# Patient Record
Sex: Male | Born: 1980 | Race: White | Hispanic: No | Marital: Married | Smoking: Current every day smoker
Health system: Southern US, Community
[De-identification: ages and names within clinical notes are randomized; demographics above are authoritative.]

## PROBLEM LIST (undated history)

## (undated) HISTORY — PX: CLEFT PALATE REPAIR: SUR1165

---

## 2000-05-16 ENCOUNTER — Emergency Department (HOSPITAL_COMMUNITY): Admission: EM | Admit: 2000-05-16 | Discharge: 2000-05-16 | Payer: Self-pay | Admitting: Emergency Medicine

## 2000-05-16 ENCOUNTER — Encounter: Payer: Self-pay | Admitting: Emergency Medicine

## 2002-05-01 ENCOUNTER — Emergency Department (HOSPITAL_COMMUNITY): Admission: EM | Admit: 2002-05-01 | Discharge: 2002-05-01 | Payer: Self-pay | Admitting: Emergency Medicine

## 2003-07-29 ENCOUNTER — Emergency Department (HOSPITAL_COMMUNITY): Admission: EM | Admit: 2003-07-29 | Discharge: 2003-07-29 | Payer: Self-pay | Admitting: Emergency Medicine

## 2004-08-19 ENCOUNTER — Emergency Department (HOSPITAL_COMMUNITY): Admission: EM | Admit: 2004-08-19 | Discharge: 2004-08-20 | Payer: Self-pay | Admitting: *Deleted

## 2005-03-23 ENCOUNTER — Emergency Department (HOSPITAL_COMMUNITY): Admission: EM | Admit: 2005-03-23 | Discharge: 2005-03-23 | Payer: Self-pay | Admitting: Emergency Medicine

## 2005-04-04 ENCOUNTER — Emergency Department (HOSPITAL_COMMUNITY): Admission: EM | Admit: 2005-04-04 | Discharge: 2005-04-04 | Payer: Self-pay | Admitting: Emergency Medicine

## 2005-11-21 ENCOUNTER — Emergency Department (HOSPITAL_COMMUNITY): Admission: EM | Admit: 2005-11-21 | Discharge: 2005-11-21 | Payer: Self-pay | Admitting: Emergency Medicine

## 2007-10-16 ENCOUNTER — Emergency Department: Payer: Self-pay | Admitting: Unknown Physician Specialty

## 2008-02-18 ENCOUNTER — Emergency Department (HOSPITAL_COMMUNITY): Admission: EM | Admit: 2008-02-18 | Discharge: 2008-02-18 | Payer: Self-pay | Admitting: Emergency Medicine

## 2008-10-16 ENCOUNTER — Emergency Department (HOSPITAL_COMMUNITY): Admission: EM | Admit: 2008-10-16 | Discharge: 2008-10-17 | Payer: Self-pay | Admitting: Emergency Medicine

## 2009-02-07 ENCOUNTER — Emergency Department (HOSPITAL_COMMUNITY): Admission: EM | Admit: 2009-02-07 | Discharge: 2009-02-07 | Payer: Self-pay | Admitting: Emergency Medicine

## 2009-02-07 ENCOUNTER — Emergency Department (HOSPITAL_COMMUNITY): Admission: EM | Admit: 2009-02-07 | Discharge: 2009-02-08 | Payer: Self-pay | Admitting: Emergency Medicine

## 2009-10-16 ENCOUNTER — Emergency Department (HOSPITAL_COMMUNITY): Admission: EM | Admit: 2009-10-16 | Discharge: 2009-10-16 | Payer: Self-pay | Admitting: Emergency Medicine

## 2010-01-07 ENCOUNTER — Emergency Department (HOSPITAL_COMMUNITY)
Admission: EM | Admit: 2010-01-07 | Discharge: 2010-01-07 | Payer: Self-pay | Source: Home / Self Care | Admitting: Emergency Medicine

## 2010-04-10 LAB — DIFFERENTIAL
Basophils Absolute: 0 10*3/uL (ref 0.0–0.1)
Basophils Relative: 0 % (ref 0–1)
Lymphocytes Relative: 16 % (ref 12–46)
Monocytes Absolute: 0.6 10*3/uL (ref 0.1–1.0)
Neutro Abs: 8.1 10*3/uL — ABNORMAL HIGH (ref 1.7–7.7)
Neutrophils Relative %: 77 % (ref 43–77)

## 2010-04-10 LAB — BASIC METABOLIC PANEL WITH GFR
BUN: 13 mg/dL (ref 6–23)
CO2: 32 meq/L (ref 19–32)
Calcium: 10 mg/dL (ref 8.4–10.5)
Chloride: 99 meq/L (ref 96–112)
Creatinine, Ser: 0.94 mg/dL (ref 0.4–1.5)
GFR calc non Af Amer: >60 mL/min
Glucose, Bld: 117 mg/dL — ABNORMAL HIGH (ref 70–99)
Potassium: 3.9 meq/L (ref 3.5–5.1)
Sodium: 139 meq/L (ref 135–145)

## 2010-04-10 LAB — CBC
HCT: 43 % (ref 39.0–52.0)
MCHC: 34 g/dL (ref 30.0–36.0)
Platelets: 249 10*3/uL (ref 150–400)
RDW: 13.7 % (ref 11.5–15.5)
WBC: 10.5 10*3/uL (ref 4.0–10.5)

## 2010-04-13 LAB — BASIC METABOLIC PANEL
BUN: 10 mg/dL (ref 6–23)
Chloride: 100 mEq/L (ref 96–112)
GFR calc non Af Amer: >60 mL/min (ref >60)
Glucose, Bld: 123 mg/dL — ABNORMAL HIGH (ref 70–99)
Potassium: 3.9 mEq/L (ref 3.5–5.1)
Sodium: 138 mEq/L (ref 135–145)

## 2010-04-13 LAB — DIFFERENTIAL
Eosinophils Absolute: 0 10*3/uL (ref 0.0–0.7)
Eosinophils Relative: 0 % (ref 0–5)
Lymphocytes Relative: 18 % (ref 12–46)
Lymphs Abs: 1.8 10*3/uL (ref 0.7–4.0)
Monocytes Absolute: 0.4 10*3/uL (ref 0.1–1.0)

## 2010-04-13 LAB — RAPID URINE DRUG SCREEN, HOSP PERFORMED
Barbiturates: NOT DETECTED
Benzodiazepines: NOT DETECTED
Cocaine: NOT DETECTED
Opiates: NOT DETECTED

## 2010-04-13 LAB — CBC
HCT: 45.1 % (ref 39.0–52.0)
Hemoglobin: 15.4 g/dL (ref 13.0–17.0)
MCV: 88.6 fL (ref 78.0–100.0)
RDW: 13.4 % (ref 11.5–15.5)

## 2010-04-13 LAB — TRICYCLICS SCREEN, URINE: TCA Scrn: NOT DETECTED

## 2010-05-22 ENCOUNTER — Emergency Department (HOSPITAL_COMMUNITY)
Admission: EM | Admit: 2010-05-22 | Discharge: 2010-05-22 | Disposition: A | Discharge status: Home / Self Care | Payer: PRIVATE HEALTH INSURANCE | Attending: Emergency Medicine | Admitting: Emergency Medicine

## 2010-05-22 DIAGNOSIS — R197 Diarrhea, unspecified: Secondary | ICD-10-CM | POA: Insufficient documentation

## 2010-05-22 DIAGNOSIS — B9789 Other viral agents as the cause of diseases classified elsewhere: Secondary | ICD-10-CM | POA: Insufficient documentation

## 2011-05-13 ENCOUNTER — Emergency Department (HOSPITAL_COMMUNITY): Payer: PRIVATE HEALTH INSURANCE

## 2011-05-13 ENCOUNTER — Encounter (HOSPITAL_COMMUNITY): Payer: Self-pay | Admitting: *Deleted

## 2011-05-13 DIAGNOSIS — M25569 Pain in unspecified knee: Secondary | ICD-10-CM | POA: Insufficient documentation

## 2011-05-13 DIAGNOSIS — F17200 Nicotine dependence, unspecified, uncomplicated: Secondary | ICD-10-CM | POA: Insufficient documentation

## 2011-05-13 NOTE — ED Notes (Signed)
Pt reports he was at work tonight and stepped wrong and now rt knee is swollen

## 2011-05-14 ENCOUNTER — Emergency Department (HOSPITAL_COMMUNITY)
Admission: EM | Admit: 2011-05-14 | Discharge: 2011-05-14 | Disposition: A | Discharge status: Home / Self Care | Payer: PRIVATE HEALTH INSURANCE | Attending: Emergency Medicine | Admitting: Emergency Medicine

## 2011-05-14 DIAGNOSIS — M25569 Pain in unspecified knee: Secondary | ICD-10-CM

## 2011-05-14 MED ORDER — NAPROXEN 250 MG PO TABS
500.0000 mg | ORAL_TABLET | Freq: Once | ORAL | Status: AC
  Administered 2011-05-14: 500 mg via ORAL
  Filled 2011-05-14: qty 2

## 2011-05-14 MED ORDER — NAPROXEN 500 MG PO TABS: 500.0000 mg | ORAL_TABLET | Freq: Two times a day (BID) | ORAL | Status: AC

## 2011-05-14 NOTE — ED Provider Notes (Addendum)
History     CSN: [id number]  Arrival date & time 05/13/11  2107   First MD Initiated Contact with Patient 05/14/11 0035      Chief Complaint  Patient presents with  . Knee Pain    (Consider location/radiation/quality/duration/timing/severity/associated sxs/prior treatment) HPI Comments: Acute onset of right knee pain that occurred just prior to arrival while he was working, felt acute onset of knee pain when he stepped in an uncomfortable fashion. He does have associated swelling, this is persistent, worse with range of motion. Denies numbness or weakness  The history is provided by the patient.    History reviewed. No pertinent past medical history.  Past Surgical History  Procedure Date  . Cleft palate repair     No family history on file.  History  Substance Use Topics  . Smoking status: Current Everyday Smoker  . Smokeless tobacco: Not on file  . Alcohol Use: No      Review of Systems  Musculoskeletal: Positive for joint swelling.  Neurological: Negative for weakness and numbness.    Allergies  Tramadol and Vicodin  Home Medications   Current Outpatient Rx  Name Route Sig Dispense Refill  . NAPROXEN 500 MG PO TABS Oral Take 1 tablet (500 mg total) by mouth 2 (two) times daily with a meal. 30 tablet 0    BP 111/58  Pulse 79  Temp 97.9 F (36.6 C)  Resp 20  Ht 5\' 9"  (1.753 m)  Wt 145 lb (65.772 kg)  BMI 21.41 kg/m2  SpO2 99%  Physical Exam  HENT:  Head: Normocephalic and atraumatic.  Eyes: Conjunctivae are normal. Right eye exhibits no discharge. Left eye exhibits no discharge. No scleral icterus.  Cardiovascular: Normal rate, regular rhythm and normal heart sounds.   Pulmonary/Chest: Effort normal and breath sounds normal. No respiratory distress. He has no wheezes.  Musculoskeletal: He exhibits tenderness ( Mild tenderness with flexion of the right knee. Knee is stable without any instability and anterior or posterior drawers or with medial or  lateral stresses). He exhibits no edema.  Neurological: He is alert. Coordination normal.  Skin: Skin is warm and dry. No rash noted. No erythema.    ED Course  Procedures (including critical care time)  Labs Reviewed - No data to display Dg Knee Complete 4 Views Right  05/13/2011  *RADIOLOGY REPORT*  Clinical Data: Increasing pain for 3 weeks with swelling.  No meniscus tear.  RIGHT KNEE - COMPLETE 4+ VIEW  Comparison: None.  Findings: Normal mineralization and alignment.  The joint spaces are maintained.  No acute fracture, significant degenerative change, or joint effusion is identified.  IMPRESSION: Head no acute findings or significant degenerative change.  Original Report Authenticated By: Britta Mccreedy, M.D.     1. Knee pain       MDM  Knee pain without any signs of instability, patient appears stable for discharge, vital signs normal, no edema erythema or swelling of the knee joint.  He states that he has already established care with orthopedist through the [facility] and will continue to followup   Discharge Prescriptions include:  Naprosyn Knee immobilizer  Vida Roller, MD 05/14/11 [id number]  Vida Roller, MD 06/20/11 [phone]

## 2011-05-14 NOTE — ED Notes (Signed)
Discharge instructions reviewed with pt, questions answered. Pt verbalized understanding.  

## 2011-05-14 NOTE — Discharge Instructions (Signed)
Your x-ray is normal showing no signs of fracture, You likely have a tear in your meniscus, use the knee immobilizer until you followup with your orthopedist. Naprosyn twice a day.

## 2012-08-02 ENCOUNTER — Ambulatory Visit (HOSPITAL_COMMUNITY): Payer: PRIVATE HEALTH INSURANCE | Admitting: Physical Therapy

## 2013-11-15 IMAGING — CR DG KNEE COMPLETE 4+V*R*
4 series · 4 of 4 positions shown · non-contrast
Comparison: None.

CLINICAL DATA: Increasing pain for 3 weeks with swelling.  No
meniscus tear.

RIGHT KNEE - COMPLETE 4+ VIEW

[view not recorded (1 of 4)]
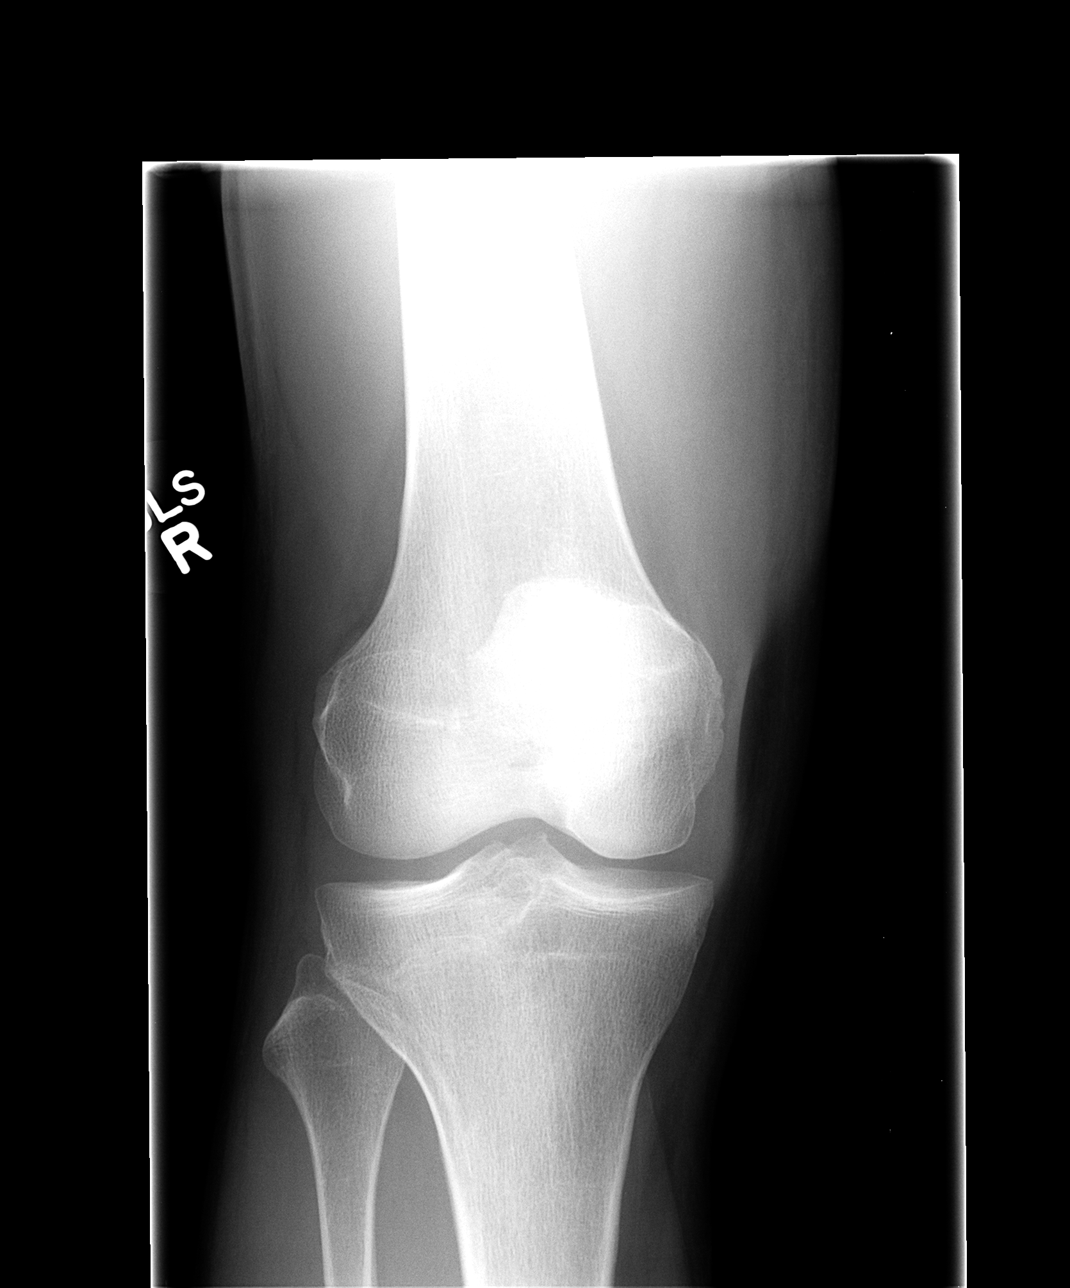

[view not recorded (2 of 4)]
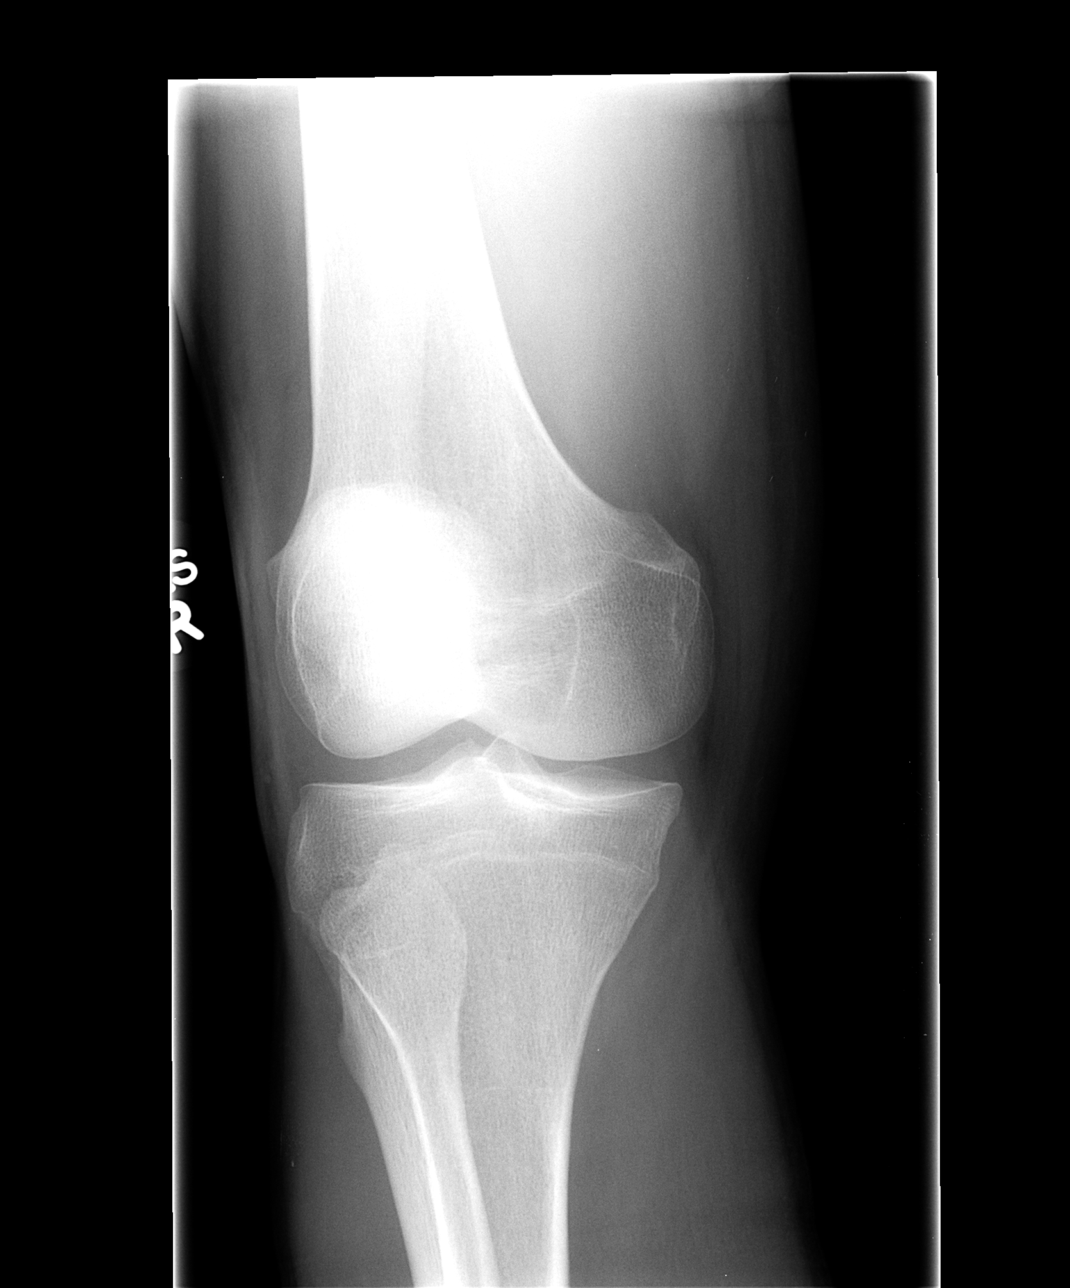

[view not recorded (3 of 4)]
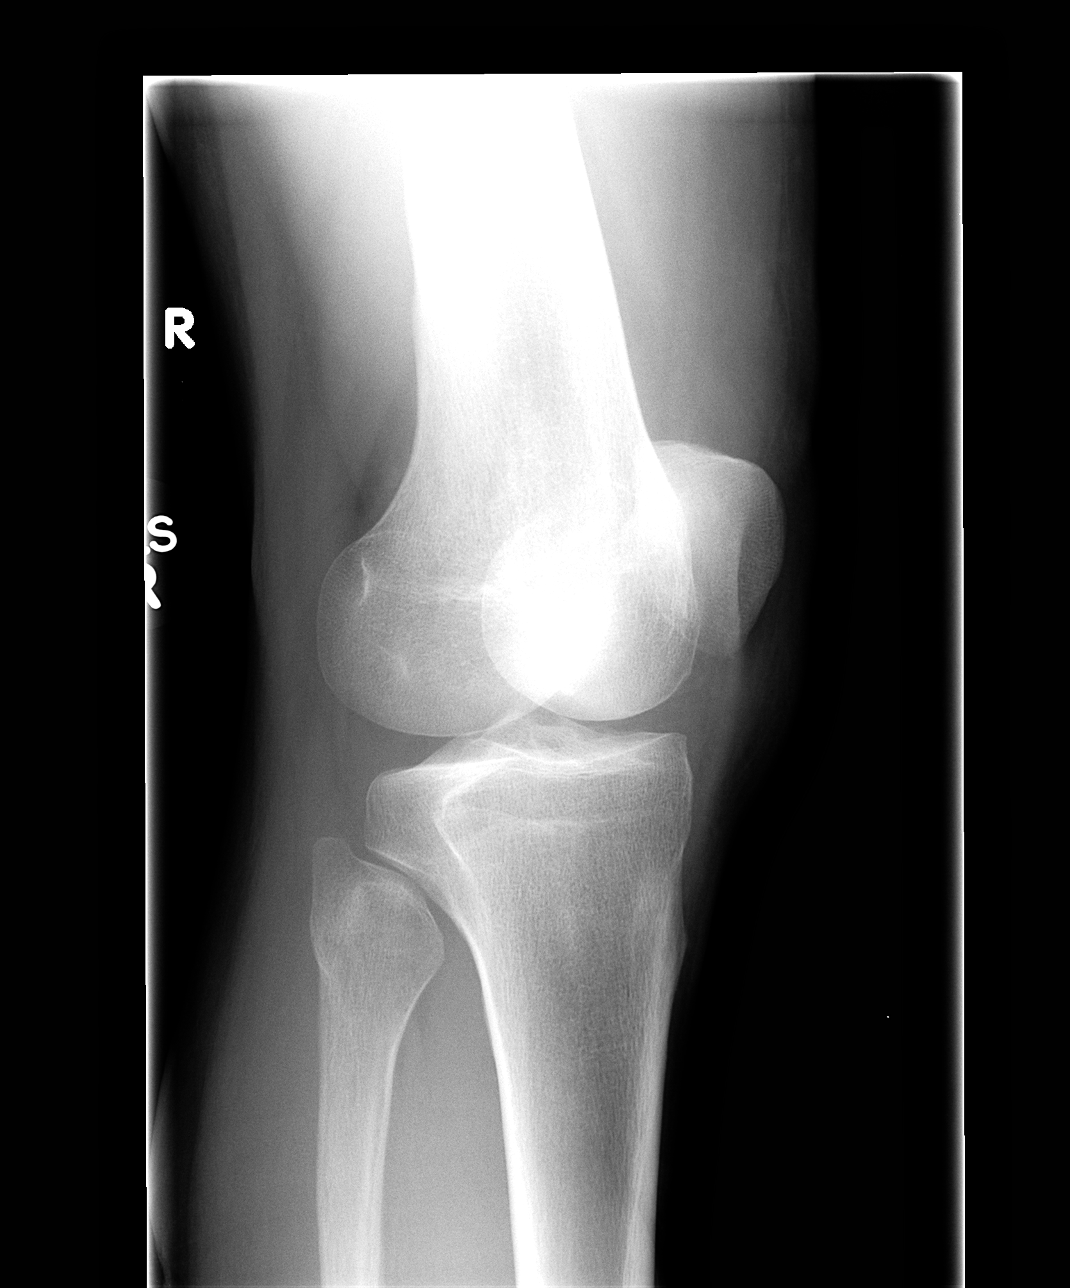

[view not recorded (4 of 4)]
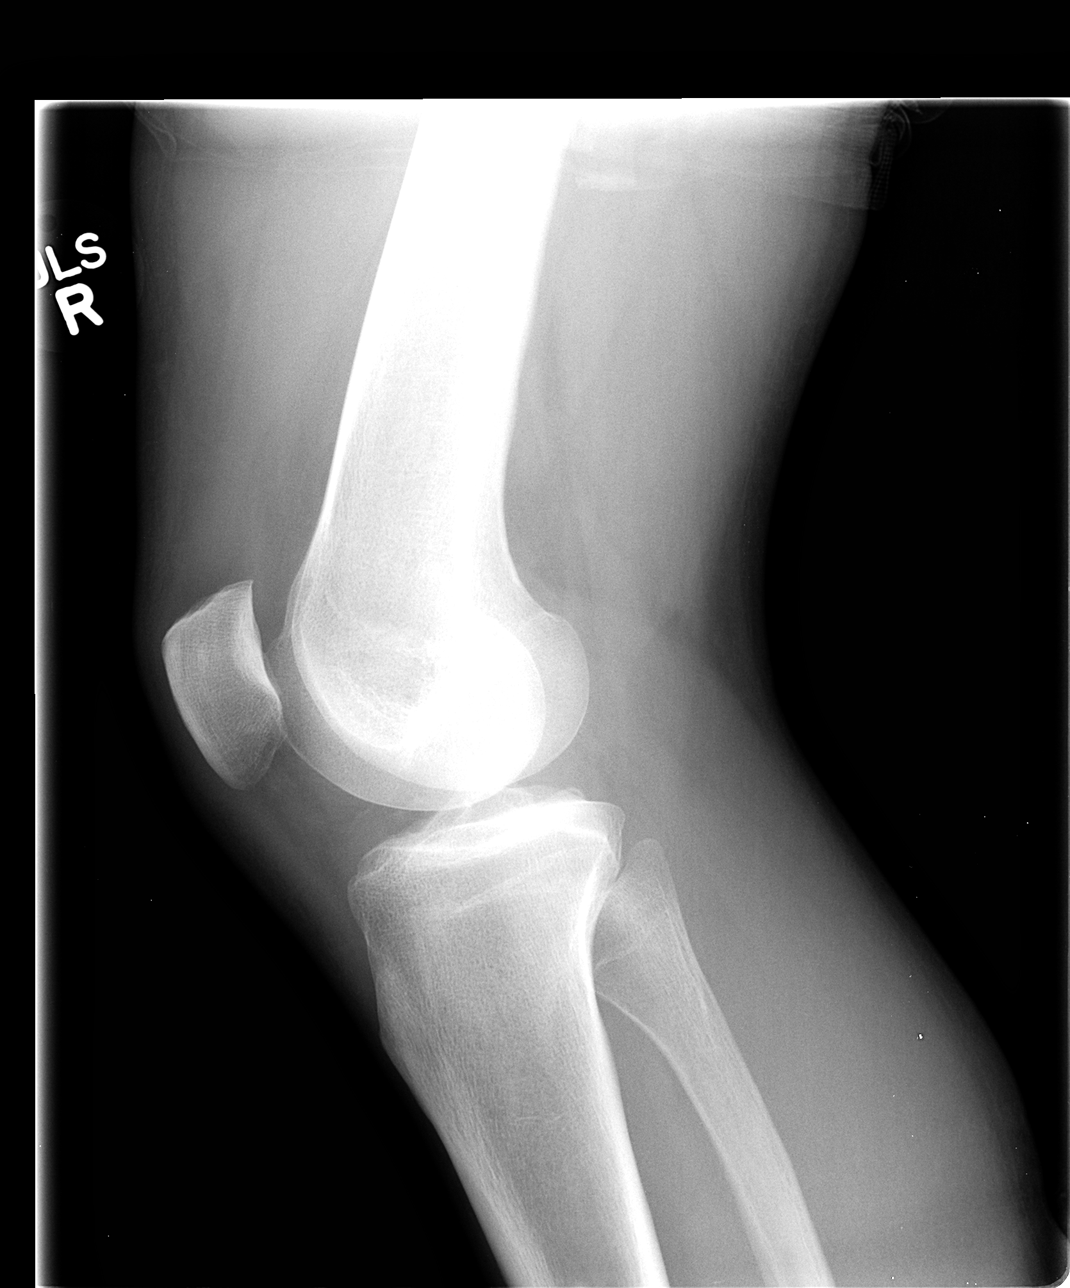

[4 of 4 positions shown; findings below may reference images not displayed]

FINDINGS: Normal mineralization and alignment.  The joint spaces
are maintained.  No acute fracture, significant degenerative
change, or joint effusion is identified.
IMPRESSION: Head no acute findings or significant degenerative change.

## 2014-10-13 ENCOUNTER — Emergency Department (HOSPITAL_COMMUNITY)
Admission: EM | Admit: 2014-10-13 | Discharge: 2014-10-13 | Disposition: A | Discharge status: Home / Self Care | Payer: BLUE CROSS/BLUE SHIELD | Attending: Emergency Medicine | Admitting: Emergency Medicine

## 2014-10-13 ENCOUNTER — Encounter (HOSPITAL_COMMUNITY): Payer: Self-pay | Admitting: Emergency Medicine

## 2014-10-13 DIAGNOSIS — Z720 Tobacco use: Secondary | ICD-10-CM | POA: Diagnosis not present

## 2014-10-13 DIAGNOSIS — M25511 Pain in right shoulder: Secondary | ICD-10-CM | POA: Diagnosis present

## 2014-10-13 DIAGNOSIS — G5601 Carpal tunnel syndrome, right upper limb: Secondary | ICD-10-CM | POA: Diagnosis not present

## 2014-10-13 NOTE — ED Notes (Signed)
Pt has good grip, pulses & sensation. Pt states pain starts in shoulder & runs down right arm

## 2014-10-13 NOTE — Discharge Instructions (Signed)
If you were given medicines take as directed.  If you are on coumadin or contraceptives realize their levels and effectiveness is altered by many different medicines.  If you have any reaction (rash, tongues swelling, other) to the medicines stop taking and see a physician.    If your blood pressure was elevated in the ER make sure you follow up for management with a primary doctor or return for chest pain, shortness of breath or stroke symptoms.  Please follow up as directed and return to the ER or see a physician for new or worsening symptoms.  Thank you. Filed Vitals:   10/13/14 2237  BP: 115/72  Pulse: 66  Temp: 97.4 F (36.3 C)  TempSrc: Oral  Resp: 14  Height:  (1.753 m)  Weight: 140 lb (63.504 kg)  SpO2: 100%

## 2014-10-13 NOTE — ED Notes (Signed)
Pt alert & oriented x4, stable gait. Patient given discharge instructions, paperwork & prescription(s). Patient  instructed to stop at the registration desk to finish any additional paperwork. Patient verbalized understanding. Pt left department w/ no further questions. 

## 2014-10-13 NOTE — ED Notes (Addendum)
Pt states his right arm feels "pain like it has been asleep and now is waking up" and it has been like this for "about a week". States has never had this problem before. No problems with grip or ROM.  Right shoulder swollen per pt.

## 2014-10-13 NOTE — ED Provider Notes (Signed)
CSN: [id number]     Arrival date & time 10/13/14  2231 History  This chart was scribe for Clifford Ohara, MD by Angelene Giovanni, ED Scribe. The patient was seen in room [facility]08/[facility]08 and the patient's care was started at 10:59 PM.      Chief Complaint  Patient presents with  . Arm Pain   Patient is a 34 y.o. male presenting with arm pain. The history is provided by the patient. No language interpreter was used.  Arm Pain This is a new problem. The current episode started more than 1 week ago. The problem occurs constantly. The problem has been gradually worsening. Nothing aggravates the symptoms. Nothing relieves the symptoms. He has tried nothing for the symptoms.   HPI Comments: Clifford Conway is a 34 y.o. male who presents to the Emergency Department complaining of right shoulder pain onset 1 week ago. He reports associated tingling in right hand. 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 He reports that he is a current smoker. He denies any past surgeries or injuries on his right shoulder.   History reviewed. No pertinent past medical history. Past Surgical History  Procedure Laterality Date  . Cleft palate repair     History reviewed. No pertinent family history. Social History  Substance Use Topics  . Smoking status: Current Every Day Smoker  . Smokeless tobacco: None  . Alcohol Use: No    Review of Systems  Constitutional: Negative for fever.  Musculoskeletal: Positive for myalgias and arthralgias.  All other systems reviewed and are negative.     Allergies  Tramadol and Vicodin  Home Medications   Prior to Admission medications   Not on File   BP 115/72 mmHg  Pulse 66  Temp(Src) 97.4 F (36.3 C) (Oral)  Resp 14  Ht  (1.753 m)  Wt 140 lb (63.504 kg)  BMI 20.67 kg/m2  SpO2 100% Physical Exam  Constitutional: He is oriented to person, place, and time. He appears well-developed and  well-nourished. No distress.  HENT:  Head: Normocephalic and atraumatic.  Eyes: Conjunctivae and EOM are normal.  Neck: Neck supple. No tracheal deviation present.  Cardiovascular: Normal rate.   Pulmonary/Chest: Effort normal. No respiratory distress.  Musculoskeletal: Normal range of motion. He exhibits tenderness.  5+ strength in major muscle groups Good internal and external rotation Mild tenderness in trapezius, mild tight musculature there.  Good strong pulse Compartments are soft, no swelling.   Neurological: He is alert and oriented to person, place, and time.  Feels large scratchy sensation  Skin: Skin is warm and dry.  Psychiatric: He has a normal mood and affect. His behavior is normal.  Nursing note and vitals reviewed.   ED Course  Procedures (including critical care time) DIAGNOSTIC STUDIES: Oxygen Saturation is 100% on RA, normal by my interpretation.    COORDINATION OF CARE: 11:06 PM- Pt advised of plan for treatment and pt agrees.    Labs Review Labs Reviewed - No data to display  Imaging Review No results found. Clifford Ohara, MD has personally reviewed and evaluated these images and lab results as part of my medical decision-making.   EKG Interpretation None      MDM   Final diagnoses:  Carpal tunnel syndrome of right wrist   Patient presents with clinical concern for carpal tunnel and tendinopathy. Discussed treatment and follow-up options. Patient does manual labor.  Results and differential  diagnosis were discussed with the patient/parent/guardian. Xrays were independently reviewed by myself.  Close follow up outpatient was discussed, comfortable with the plan.   Medications - No data to display  Filed Vitals:   10/13/14 2237  BP: 115/72  Pulse: 66  Temp: 97.4 F (36.3 C)  TempSrc: Oral  Resp: 14  Height: 5\' 9"  (1.753 m)  Weight: 140 lb (63.504 kg)  SpO2: 100%    Final diagnoses:  Carpal tunnel syndrome of right wrist       Clifford Ohara, MD 10/13/14 2310

## 2016-04-26 ENCOUNTER — Emergency Department (HOSPITAL_COMMUNITY): Payer: 59

## 2016-04-26 ENCOUNTER — Encounter (HOSPITAL_COMMUNITY): Payer: Self-pay

## 2016-04-26 ENCOUNTER — Emergency Department (HOSPITAL_COMMUNITY)
Admission: EM | Admit: 2016-04-26 | Discharge: 2016-04-26 | Disposition: A | Discharge status: Home / Self Care | Payer: 59 | Attending: Emergency Medicine | Admitting: Emergency Medicine

## 2016-04-26 DIAGNOSIS — F17210 Nicotine dependence, cigarettes, uncomplicated: Secondary | ICD-10-CM | POA: Insufficient documentation

## 2016-04-26 DIAGNOSIS — S60221A Contusion of right hand, initial encounter: Secondary | ICD-10-CM | POA: Insufficient documentation

## 2016-04-26 DIAGNOSIS — Y999 Unspecified external cause status: Secondary | ICD-10-CM | POA: Insufficient documentation

## 2016-04-26 DIAGNOSIS — F17290 Nicotine dependence, other tobacco product, uncomplicated: Secondary | ICD-10-CM | POA: Diagnosis not present

## 2016-04-26 DIAGNOSIS — W228XXA Striking against or struck by other objects, initial encounter: Secondary | ICD-10-CM | POA: Insufficient documentation

## 2016-04-26 DIAGNOSIS — Y9389 Activity, other specified: Secondary | ICD-10-CM | POA: Diagnosis not present

## 2016-04-26 DIAGNOSIS — Y929 Unspecified place or not applicable: Secondary | ICD-10-CM | POA: Diagnosis not present

## 2016-04-26 DIAGNOSIS — S6991XA Unspecified injury of right wrist, hand and finger(s), initial encounter: Secondary | ICD-10-CM | POA: Diagnosis present

## 2016-04-26 NOTE — ED Triage Notes (Signed)
Patient reports of hitting right hand on door facing Friday night.

## 2016-04-26 NOTE — ED Provider Notes (Signed)
[facility]-EMERGENCY DEPT Provider Note   CSN: [id number] Arrival date & time: 04/26/16  1440  By signing my name below, I, Nelwyn Salisbury, attest that this documentation has been prepared under the direction and in the presence of non-physician practitioner, Ivery Quale, PA-C. Electronically Signed: Nelwyn Salisbury, Scribe. 04/26/2016. 2:56 PM.  History   Chief Complaint Chief Complaint  Patient presents with  . Hand Injury   The history is provided by the patient. No language interpreter was used.    HPI Comments:  Clifford Conway is a 36 y.o. male who presents to the Emergency Department complaining of constant, mild right hand pain onset 2 days. Pt states that he attempted to punch a door frame when his pain started. His pain is exacerbated with movement and use. Pt reports associated swelling to the area. Denies any weakness or numbness.  History reviewed. No pertinent past medical history.  There are no active problems to display for this patient.   Past Surgical History:  Procedure Laterality Date  . CLEFT PALATE REPAIR         Home Medications    Prior to Admission medications   Not on File    Family History No family history on file.  Social History Social History  Substance Use Topics  . Smoking status: Current Every Day Smoker    Packs/day: 1.00    Types: Cigarettes  . Smokeless tobacco: Current User    Types: Snuff  . Alcohol use No     Allergies   Tramadol and Vicodin [hydrocodone-acetaminophen]   Review of Systems Review of Systems  Musculoskeletal: Positive for arthralgias and joint swelling.  Neurological: Negative for weakness and numbness.     Physical Exam Updated Vital Signs Pulse 78   Temp 97.8 F (36.6 C) (Oral)   Resp 18   Ht  (1.753 m)   Wt 140 lb (63.5 kg)   SpO2 100%   BMI 20.67 kg/m   Physical Exam  Constitutional: He is oriented to person, place, and time. He appears well-developed and well-nourished. No distress.    HENT:  Head: Normocephalic and atraumatic.  Eyes: Conjunctivae are normal.  Cardiovascular: Normal rate.   Pulmonary/Chest: Effort normal.  Abdominal: He exhibits no distension.  Musculoskeletal: Normal range of motion.  Right radial pulse is 2+. Capillary refill <2. Good ROM of fingers and wrist, but with some pain. Full ROM of right shoulder and right elbow. Mild swelling of the fifth metacarpal on right. No swelling or deformity of the thenar eminence.   Neurological: He is alert and oriented to person, place, and time.  Skin: Skin is warm and dry.  Psychiatric: He has a normal mood and affect.  Nursing note and vitals reviewed.    ED Treatments / Results  DIAGNOSTIC STUDIES:  Oxygen Saturation is 100% on RA, normal by my interpretation.    COORDINATION OF CARE:  2:55 PM Discussed treatment plan with pt at bedside and pt agreed to plan.  Labs (all labs ordered are listed, but only abnormal results are displayed) Labs Reviewed - No data to display  EKG  EKG Interpretation None       Radiology No results found.  Procedures Procedures (including critical care time)  Medications Ordered in ED Medications - No data to display   Initial Impression / Assessment and Plan / ED Course  I have reviewed the triage vital signs and the nursing notes.  Pertinent labs & imaging results that were available during my care of  the patient were reviewed by me and considered in my medical decision making (see chart for details).     **I have reviewed nursing notes, vital signs, and all appropriate lab and imaging results for this patient.*  Final Clinical Impressions(s) / ED Diagnoses MDM There no gross neurologic deficits appreciated on upper extremity examination. X-ray of the right hand is negative for fracture or dislocation. I discussed these findings with the patient in terms which he understands. Patient is to use Tylenol or ibuprofen for soreness. He will see Dr.  Romeo Apple for orthopedic opinion if not improving.    Final diagnoses:  Contusion of right hand, initial encounter    New Prescriptions I personally performed the services described in this documentation, which was scribed in my presence. The recorded information has been reviewed and is accurate.    Ivery Quale, PA-C 04/26/16 1624    Mancel Bale, MD 04/27/16 1007

## 2016-04-26 NOTE — Discharge Instructions (Signed)
Your nerve and neurologic function is within normal limits. Your vital signs within normal limits. The x-ray of your hand is negative for fracture or dislocation. Please use Tylenol, and or ibuprofen for soreness. See Dr. Romeo Apple for orthopedic evaluation if not improving.
